# Patient Record
Sex: Male | Born: 2000 | Hispanic: Yes | Marital: Single | Smoking: Never smoker
Health system: Southern US, Community
[De-identification: ages and names within clinical notes are randomized; demographics above are authoritative.]

## PROBLEM LIST (undated history)

## (undated) DIAGNOSIS — R519 Headache, unspecified: Secondary | ICD-10-CM

## (undated) HISTORY — PX: NO PAST SURGERIES: SHX2092

---
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
## 2022-02-11 ENCOUNTER — Telehealth: Payer: BC Managed Care – PPO | Admitting: Nurse Practitioner

## 2022-02-11 DIAGNOSIS — H1031 Unspecified acute conjunctivitis, right eye: Secondary | ICD-10-CM

## 2022-02-11 DIAGNOSIS — J020 Streptococcal pharyngitis: Secondary | ICD-10-CM

## 2022-02-11 MED ORDER — POLYMYXIN B-TRIMETHOPRIM 10000-0.1 UNIT/ML-% OP SOLN: 2.0000 [drp] | OPHTHALMIC | 0 refills | Status: DC

## 2022-02-11 MED ORDER — AMOXICILLIN 500 MG PO CAPS: 500.0000 mg | ORAL_CAPSULE | Freq: Three times a day (TID) | ORAL | 0 refills | Status: AC

## 2022-02-11 NOTE — Patient Instructions (Signed)
Force fluids °Motrin or tylenol OTC °OTC decongestant °Throat lozenges if help °New toothbrush in 3 days ° °

## 2022-02-11 NOTE — Progress Notes (Signed)
? ?Virtual Visit Consent  ? ?Donald Dyer, you are scheduled for a virtual visit with Mary-Margaret Hassell Done, Aurelia, a [facility] provider, today.   ?  ?Just as with appointments in the office, your consent must be obtained to participate.  Your consent will be active for this visit and any virtual visit you may have with one of our providers in the next 365 days.   ?  ?If you have a MyChart account, a copy of this consent can be sent to you electronically.  All virtual visits are billed to your insurance company just like a traditional visit in the office.   ? ?As this is a virtual visit, video technology does not allow for your provider to perform a traditional examination.  This may limit your provider's ability to fully assess your condition.  If your provider identifies any concerns that need to be evaluated in person or the need to arrange testing (such as labs, EKG, etc.), we will make arrangements to do so.   ?  ?Although advances in technology are sophisticated, we cannot ensure that it will always work on either your end or our end.  If the connection with a video visit is poor, the visit may have to be switched to a telephone visit.  With either a video or telephone visit, we are not always able to ensure that we have a secure connection.    ? ?I need to obtain your verbal consent now.   Are you willing to proceed with your visit today? YES ?  ?ANGELICA STABLEIN has provided verbal consent on 02/11/2022 for a virtual visit (video or telephone). ?  ?Mary-Margaret Hassell Done, FNP  ? ?Date: 02/11/2022 12:34 PM ? ? ?Virtual Visit via Video Note  ? ?I, Mary-Margaret Hassell Done, connected with BRENN FUQUAY ([id number], [birth year]) on 02/11/22 at 12:30 PM EDT by a video-enabled telemedicine application and verified that I am speaking with the correct person using two identifiers. ? ?Location: ?Patient: Virtual Visit Location Patient: Home ?Provider: Virtual Visit Location Provider: Mobile ?  ?I discussed the limitations of  evaluation and management by telemedicine and the availability of in person appointments. The patient expressed understanding and agreed to proceed.   ? ?History of Present Illness: ?Donald Dyer is a 21 y.o. who identifies as a male who was assigned male at birth, and is being seen today for sore throat and pink eye. ? ?HPI: Sore Throat  ?This is a new problem. The current episode started in the past 7 days (5days ago and is no better). The problem has been waxing and waning. Neither side of throat is experiencing more pain than the other. There has been no fever. The pain is at a severity of 7/10. The pain is moderate. Associated symptoms include congestion, coughing and trouble swallowing. Pertinent negatives include no shortness of breath or swollen glands. He has had exposure to strep. Exposure to: at school. The treatment provided moderate relief.  ?Conjunctivitis  ?The current episode started 2 days ago. The onset was gradual. The problem occurs continuously. The problem has been gradually worsening. The problem is mild. Associated symptoms include congestion, cough and eye redness. Pertinent negatives include no decreased vision, no double vision, no photophobia and no swollen glands.   ?Review of Systems  ?HENT:  Positive for congestion and trouble swallowing.   ?Eyes:  Positive for redness. Negative for double vision and photophobia.  ?Respiratory:  Positive for cough. Negative for shortness of breath.   ? ?  Problems: There are no problems to display for this patient. ?  ?Allergies: No Known Allergies ?Medications: No current outpatient medications on file. ? ?Observations/Objective: ?Patient is well-developed, well-nourished in no acute distress.  ?Resting comfortably  at home.  ?Head is normocephalic, atraumatic.  ?No labored breathing.  ?Speech is clear and coherent with logical content.  ?Patient is alert and oriented at baseline.  ?Right scleral injection ? ?Assessment and Plan: ? ?Donald Dyer  in today with chief complaint of Sore Throat and Conjunctivitis ? ? ?1. Pharyngitis due to Streptococcus species ?Force fluids ?Motrin or tylenol OTC ?OTC decongestant ?Throat lozenges if help ?New toothbrush in 3 days ? ?- amoxicillin (AMOXIL) 500 MG capsule; Take 1 capsule (500 mg total) by mouth 3 (three) times daily for 10 days.  Dispense: 20 capsule; Refill: 0 ? ?2. Acute bacterial conjunctivitis of right eye ?Avid rubbing eye ?Good handwashing ?Col compresses ?- trimethoprim-polymyxin b (POLYTRIM) ophthalmic solution; Place 2 drops into both eyes every 4 (four) hours.  Dispense: 10 mL; Refill: 0 ? ? ? ? ?Follow Up Instructions: ?I discussed the assessment and treatment plan with the patient. The patient was provided an opportunity to ask questions and all were answered. The patient agreed with the plan and demonstrated an understanding of the instructions.  A copy of instructions were sent to the patient via MyChart. ? ?The patient was advised to call back or seek an in-person evaluation if the symptoms worsen or if the condition fails to improve as anticipated. ? ?Time:  ?I spent 15 minutes with the patient via telehealth technology discussing the above problems/concerns.   ? ?Mary-Margaret Hassell Done, FNP ? ?

## 2022-02-20 ENCOUNTER — Telehealth: Payer: BC Managed Care – PPO | Admitting: Emergency Medicine

## 2022-02-20 DIAGNOSIS — R6889 Other general symptoms and signs: Secondary | ICD-10-CM

## 2022-02-20 MED ORDER — CETIRIZINE HCL 10 MG PO TABS: 10.0000 mg | ORAL_TABLET | Freq: Every day | ORAL | 0 refills | Status: AC

## 2022-02-20 NOTE — Progress Notes (Signed)
?Virtual Visit Consent  ? ?Donald Dyer, you are scheduled for a virtual visit with a [facility] provider today.   ?  ?Just as with appointments in the office, your consent must be obtained to participate.  Your consent will be active for this visit and any virtual visit you may have with one of our providers in the next 365 days.   ?  ?If you have a MyChart account, a copy of this consent can be sent to you electronically.  All virtual visits are billed to your insurance company just like a traditional visit in the office.   ? ?As this is a virtual visit, video technology does not allow for your provider to perform a traditional examination.  This may limit your provider's ability to fully assess your condition.  If your provider identifies any concerns that need to be evaluated in person or the need to arrange testing (such as labs, EKG, etc.), we will make arrangements to do so.   ?  ?Although advances in technology are sophisticated, we cannot ensure that it will always work on either your end or our end.  If the connection with a video visit is poor, the visit may have to be switched to a telephone visit.  With either a video or telephone visit, we are not always able to ensure that we have a secure connection.    ? ?I need to obtain your verbal consent now.   Are you willing to proceed with your visit today?  ?  ?Donald Dyer has provided verbal consent on 02/20/2022 for a virtual visit (video or telephone). ?  ?Montine Circle, PA-C  ? ?Date: 02/20/2022 1:55 PM ? ? ?Virtual Visit via Video Note  ? ?Donald Dyer, connected with  Donald Dyer  ([id number], 2001/02/22) on 02/20/22 at  1:45 PM EDT by a video-enabled telemedicine application and verified that I am speaking with the correct person using two identifiers. ? ?Location: ?Patient: Virtual Visit Location Patient: Home ?Provider: Virtual Visit Location Provider: Home Office ?  ?I discussed the limitations of evaluation and management by  telemedicine and the availability of in person appointments. The patient expressed understanding and agreed to proceed.   ? ?History of Present Illness: ?Donald Dyer is a 21 y.o. who identifies as a male who was assigned male at birth, and is being seen today for itchy eyes.  He states that he has been on Polytrim for the past 10 days.  Denies any changes or improvement.  States that his eyes only feel itchy and irritated.  Denies other symptoms currently. ? ?HPI: HPI  ?Problems: There are no problems to display for this patient. ?  ?Allergies: No Known Allergies ?Medications:  ?Current Outpatient Medications:  ?  amoxicillin (AMOXIL) 500 MG capsule, Take 1 capsule (500 mg total) by mouth 3 (three) times daily for 10 days., Disp: 20 capsule, Rfl: 0 ?  trimethoprim-polymyxin b (POLYTRIM) ophthalmic solution, Place 2 drops into both eyes every 4 (four) hours., Disp: 10 mL, Rfl: 0 ? ?Observations/Objective: ?Patient is well-developed, well-nourished in no acute distress.  ?Resting comfortably  at home.  ?Head is normocephalic, atraumatic.  ?No labored breathing.  ?Speech is clear and coherent with logical content.  ?Patient is alert and oriented at baseline.  ?No visible significant conjunctival erythema or discharge ? ?Assessment and Plan: ?1. Itchy eyes ? ?- Discontinue all eye drops.  Conjunctivitis medicamentosa vs allergic conjunctivitis ?-Start zyrtec ?-Opthalm f/u if not improving in the  next 2-3 days. ? ? ?Follow Up Instructions: ?I discussed the assessment and treatment plan with the patient. The patient was provided an opportunity to ask questions and all were answered. The patient agreed with the plan and demonstrated an understanding of the instructions.  A copy of instructions were sent to the patient via MyChart unless otherwise noted below.  ? ? ? ?The patient was advised to call back or seek an in-person evaluation if the symptoms worsen or if the condition fails to improve as anticipated. ? ?Time:   ?I spent 17 minutes with the patient via telehealth technology discussing the above problems/concerns.   ? ?Montine Circle, PA-C ? ?

## 2022-02-20 NOTE — Patient Instructions (Addendum)
-   Discontinue all eye drops.  Conjunctivitis medicamentosa vs allergic conjunctivitis ?- Start zyrtec ?- Follow-up with your eye doctor if not improving in the next 2-3 days. ?

## 2022-02-28 DIAGNOSIS — H1045 Other chronic allergic conjunctivitis: Secondary | ICD-10-CM | POA: Diagnosis not present

## 2022-06-10 ENCOUNTER — Telehealth: Payer: Medicaid Other | Admitting: Physician Assistant

## 2022-06-10 DIAGNOSIS — H9202 Otalgia, left ear: Secondary | ICD-10-CM | POA: Diagnosis not present

## 2022-06-10 MED ORDER — AMOXICILLIN 875 MG PO TABS: 875.0000 mg | ORAL_TABLET | Freq: Two times a day (BID) | ORAL | 0 refills | Status: AC

## 2022-06-10 NOTE — Patient Instructions (Signed)
I think you may have an ear infection. Start the antibiotic that I have sent in.  May take ibuprofen per package instructions for pain/inflammation.  If no improvement or if worsening, please seek follow-up care in person.

## 2022-06-10 NOTE — Progress Notes (Signed)
Virtual Visit Consent   VASILI Dyer, you are scheduled for a virtual visit with a [facility] provider today. Just as with appointments in the office, your consent must be obtained to participate. Your consent will be active for this visit and any virtual visit you may have with one of our providers in the next 365 days. If you have a MyChart account, a copy of this consent can be sent to you electronically.  As this is a virtual visit, video technology does not allow for your provider to perform a traditional examination. This may limit your provider's ability to fully assess your condition. If your provider identifies any concerns that need to be evaluated in person or the need to arrange testing (such as labs, EKG, etc.), we will make arrangements to do so. Although advances in technology are sophisticated, we cannot ensure that it will always work on either your end or our end. If the connection with a video visit is poor, the visit may have to be switched to a telephone visit. With either a video or telephone visit, we are not always able to ensure that we have a secure connection.  By engaging in this virtual visit, you consent to the provision of healthcare and authorize for your insurance to be billed (if applicable) for the services provided during this visit. Depending on your insurance coverage, you may receive a charge related to this service.  I need to obtain your verbal consent now. Are you willing to proceed with your visit today? [name] has provided verbal consent on 06/10/2022 for a virtual visit (video or telephone). Donald Dyer, [place]  Date: 06/10/2022 12:02 PM  Virtual Visit via Video Note   I, Donald Dyer, connected with  Donald Dyer  ([id number], October 07, 2001) on 06/10/22 at 12:00 PM EDT by a video-enabled telemedicine application and verified that I am speaking with the correct person using two identifiers.  Location: Patient: Virtual Visit Location Patient:  Home Provider: Virtual Visit Location Provider: Office/Clinic   I discussed the limitations of evaluation and management by telemedicine and the availability of in person appointments. The patient expressed understanding and agreed to proceed.    History of Present Illness: Donald Dyer is a 21 y.o. who identifies as a male who was assigned male at birth, and is being seen today for ear pain. Has had ear pain x a few days, worsening with time. Had a cold before this started. Hx of ear infections, this feels the same. Pain is 5/10 and is throbbing. Denies: fevers, chills, neck stiffness, throat pain. Has tried OTC medication (tylenol) without improvement of sx.  HPI: HPI  Problems: There are no problems to display for this patient.   Allergies: No Known Allergies Medications:  Current Outpatient Medications:    amoxicillin (AMOXIL) 875 MG tablet, Take 1 tablet (875 mg total) by mouth 2 (two) times daily for 10 days., Disp: 20 tablet, Rfl: 0   cetirizine (ZYRTEC) 10 MG tablet, Take 1 tablet (10 mg total) by mouth daily., Disp: 30 tablet, Rfl: 0  Observations/Objective: Patient is well-developed, well-nourished in no acute distress.  Resting comfortably  at home.  Head is normocephalic, atraumatic.  No labored breathing.  Speech is clear and coherent with logical content.  Patient is alert and oriented at baseline.   Assessment and Plan: 1. Left ear pain Suspect possible AOM after URI Start amoxicillin 875 mg BID x 10 days Close follow-up with in person provider if lack of  improvement Recommend NSAIDs prn for pain/inflammation  Follow Up Instructions: I discussed the assessment and treatment plan with the patient. The patient was provided an opportunity to ask questions and all were answered. The patient agreed with the plan and demonstrated an understanding of the instructions.  A copy of instructions were sent to the patient via MyChart unless otherwise noted below.   The patient  was advised to call back or seek an in-person evaluation if the symptoms worsen or if the condition fails to improve as anticipated.  Time:  I spent 5-10 minutes with the patient via telehealth technology discussing the above problems/concerns.    Donald Dyer, [place]

## 2022-06-11 ENCOUNTER — Emergency Department
Admission: EM | Admit: 2022-06-11 | Discharge: 2022-06-11 | Disposition: A | Discharge status: Home / Self Care | Payer: Medicaid Other | Attending: Emergency Medicine | Admitting: Emergency Medicine

## 2022-06-11 DIAGNOSIS — Y92410 Unspecified street and highway as the place of occurrence of the external cause: Secondary | ICD-10-CM | POA: Diagnosis not present

## 2022-06-11 DIAGNOSIS — M542 Cervicalgia: Secondary | ICD-10-CM | POA: Diagnosis present

## 2022-06-11 DIAGNOSIS — M545 Low back pain, unspecified: Secondary | ICD-10-CM | POA: Diagnosis not present

## 2022-06-11 DIAGNOSIS — M79602 Pain in left arm: Secondary | ICD-10-CM | POA: Insufficient documentation

## 2022-06-11 MED ORDER — NAPROXEN 500 MG PO TABS: 500.0000 mg | ORAL_TABLET | Freq: Two times a day (BID) | ORAL | 0 refills | Status: AC

## 2022-06-11 MED ORDER — CYCLOBENZAPRINE HCL 10 MG PO TABS: 10.0000 mg | ORAL_TABLET | Freq: Three times a day (TID) | ORAL | 0 refills | Status: AC | PRN
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
## 2022-06-11 NOTE — Discharge Instructions (Addendum)
-  You may treat pain with Tylenol and naproxen as needed.  You may take the cyclobenzaprine as a muscle relaxer, though use caution as it may make you drowsy.  -Return to the emergency department anytime if you begin to experience any new or worsening symptoms.
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
